# Patient Record
Sex: Male | Born: 1990 | Race: Black or African American | Hispanic: No | Marital: Single | Smoking: Former smoker
Health system: Southern US, Community
[De-identification: ages and names within clinical notes are randomized; demographics above are authoritative.]

---

## 2014-03-30 ENCOUNTER — Encounter (HOSPITAL_COMMUNITY): Payer: Self-pay | Admitting: Emergency Medicine

## 2014-03-30 ENCOUNTER — Emergency Department (HOSPITAL_COMMUNITY)
Admission: EM | Admit: 2014-03-30 | Discharge: 2014-03-30 | Disposition: A | Discharge status: Home / Self Care | Payer: Federal, State, Local not specified - PPO | Attending: Emergency Medicine | Admitting: Emergency Medicine

## 2014-03-30 ENCOUNTER — Emergency Department (HOSPITAL_COMMUNITY): Payer: Federal, State, Local not specified - PPO

## 2014-03-30 DIAGNOSIS — Z87891 Personal history of nicotine dependence: Secondary | ICD-10-CM | POA: Diagnosis not present

## 2014-03-30 DIAGNOSIS — S62314A Displaced fracture of base of fourth metacarpal bone, right hand, initial encounter for closed fracture: Secondary | ICD-10-CM | POA: Diagnosis not present

## 2014-03-30 DIAGNOSIS — Y92410 Unspecified street and highway as the place of occurrence of the external cause: Secondary | ICD-10-CM | POA: Insufficient documentation

## 2014-03-30 DIAGNOSIS — S6991XA Unspecified injury of right wrist, hand and finger(s), initial encounter: Secondary | ICD-10-CM | POA: Diagnosis present

## 2014-03-30 DIAGNOSIS — Y9389 Activity, other specified: Secondary | ICD-10-CM | POA: Diagnosis not present

## 2014-03-30 DIAGNOSIS — S62322A Displaced fracture of shaft of third metacarpal bone, right hand, initial encounter for closed fracture: Secondary | ICD-10-CM | POA: Insufficient documentation

## 2014-03-30 DIAGNOSIS — S6291XA Unspecified fracture of right wrist and hand, initial encounter for closed fracture: Secondary | ICD-10-CM

## 2014-03-30 MED ORDER — OXYCODONE-ACETAMINOPHEN 5-325 MG PO TABS
1.0000 | ORAL_TABLET | Freq: Once | ORAL | Status: AC
  Administered 2014-03-30: 1 via ORAL
  Filled 2014-03-30: qty 1

## 2014-03-30 MED ORDER — OXYCODONE-ACETAMINOPHEN 5-325 MG PO TABS: 1.0000 | ORAL_TABLET | ORAL | Status: AC | PRN

## 2014-03-30 MED ORDER — NAPROXEN 500 MG PO TABS: 500.0000 mg | ORAL_TABLET | Freq: Two times a day (BID) | ORAL | Status: AC
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
## 2014-03-30 NOTE — Discharge Instructions (Signed)
Call Dr. Mort SawyersHarrison's office for follow up.

## 2014-03-31 ENCOUNTER — Ambulatory Visit: Payer: Federal, State, Local not specified - PPO | Admitting: Orthopedic Surgery

## 2014-04-05 ENCOUNTER — Encounter: Payer: Self-pay | Admitting: Orthopedic Surgery

## 2014-04-05 ENCOUNTER — Ambulatory Visit (INDEPENDENT_AMBULATORY_CARE_PROVIDER_SITE_OTHER): Payer: Federal, State, Local not specified - PPO | Admitting: Orthopedic Surgery

## 2014-04-05 VITALS — BP 154/86 | Ht 72.0 in | Wt 215.0 lb

## 2014-04-05 DIAGNOSIS — S62308A Unspecified fracture of other metacarpal bone, initial encounter for closed fracture: Secondary | ICD-10-CM

## 2014-04-05 MED ORDER — HYDROCODONE-ACETAMINOPHEN 7.5-325 MG PO TABS
1.0000 | ORAL_TABLET | Freq: Four times a day (QID) | ORAL | Status: AC | PRN
Start: 2014-04-05 — End: ?
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
## 2014-04-05 NOTE — Patient Instructions (Signed)
WORK NOTE

## 2014-05-05 ENCOUNTER — Ambulatory Visit (INDEPENDENT_AMBULATORY_CARE_PROVIDER_SITE_OTHER): Payer: Self-pay | Admitting: Orthopedic Surgery

## 2014-05-05 ENCOUNTER — Ambulatory Visit (INDEPENDENT_AMBULATORY_CARE_PROVIDER_SITE_OTHER): Payer: Federal, State, Local not specified - PPO

## 2014-05-05 ENCOUNTER — Encounter: Payer: Self-pay | Admitting: Orthopedic Surgery

## 2014-05-05 VITALS — BP 130/76 | Ht 72.0 in | Wt 215.0 lb

## 2014-05-05 DIAGNOSIS — S62309D Unspecified fracture of unspecified metacarpal bone, subsequent encounter for fracture with routine healing: Secondary | ICD-10-CM

## 2014-05-05 DIAGNOSIS — S62609D Fracture of unspecified phalanx of unspecified finger, subsequent encounter for fracture with routine healing: Secondary | ICD-10-CM
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
## 2014-05-05 NOTE — Addendum Note (Signed)
Addended by: Vickki HearingHARRISON, Crystel Demarco E on: 05/05/2014 03:15 PM   Modules accepted: Medications

## 2014-05-05 NOTE — Patient Instructions (Signed)
Brace 3 weeks except bathing

## 2014-05-30 ENCOUNTER — Encounter: Payer: Self-pay | Admitting: Orthopedic Surgery

## 2014-05-30 ENCOUNTER — Ambulatory Visit (INDEPENDENT_AMBULATORY_CARE_PROVIDER_SITE_OTHER): Payer: Self-pay | Admitting: Orthopedic Surgery

## 2014-05-30 ENCOUNTER — Ambulatory Visit (INDEPENDENT_AMBULATORY_CARE_PROVIDER_SITE_OTHER): Payer: Federal, State, Local not specified - PPO

## 2014-05-30 VITALS — BP 127/82 | Ht 72.0 in | Wt 215.0 lb

## 2014-05-30 DIAGNOSIS — S62309A Unspecified fracture of unspecified metacarpal bone, initial encounter for closed fracture: Secondary | ICD-10-CM | POA: Insufficient documentation

## 2014-05-30 DIAGNOSIS — S62609D Fracture of unspecified phalanx of unspecified finger, subsequent encounter for fracture with routine healing: Secondary | ICD-10-CM

## 2014-05-30 DIAGNOSIS — S62309D Unspecified fracture of unspecified metacarpal bone, subsequent encounter for fracture with routine healing: Secondary | ICD-10-CM
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
## 2014-05-30 NOTE — Patient Instructions (Signed)
Return to work 06/06/14
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
## 2024-03-31 ENCOUNTER — Other Ambulatory Visit (HOSPITAL_COMMUNITY): Payer: Self-pay
